# Patient Record
Sex: Female | Born: 1960 | Race: White | Hispanic: No | Marital: Single | Smoking: Never smoker
Health system: Southern US, Community
[De-identification: ages and names within clinical notes are randomized; demographics above are authoritative.]

## PROBLEM LIST (undated history)

## (undated) DIAGNOSIS — B9689 Other specified bacterial agents as the cause of diseases classified elsewhere: Secondary | ICD-10-CM

## (undated) DIAGNOSIS — K219 Gastro-esophageal reflux disease without esophagitis: Secondary | ICD-10-CM

## (undated) DIAGNOSIS — N760 Acute vaginitis: Secondary | ICD-10-CM

## (undated) DIAGNOSIS — N9489 Other specified conditions associated with female genital organs and menstrual cycle: Secondary | ICD-10-CM

## (undated) DIAGNOSIS — G809 Cerebral palsy, unspecified: Secondary | ICD-10-CM

## (undated) DIAGNOSIS — F419 Anxiety disorder, unspecified: Secondary | ICD-10-CM

## (undated) DIAGNOSIS — N898 Other specified noninflammatory disorders of vagina: Principal | ICD-10-CM

## (undated) DIAGNOSIS — R234 Changes in skin texture: Secondary | ICD-10-CM

## (undated) HISTORY — PX: MANDIBLE FRACTURE SURGERY: SHX706

## (undated) HISTORY — DX: Other specified conditions associated with female genital organs and menstrual cycle: N94.89

## (undated) HISTORY — DX: Anxiety disorder, unspecified: F41.9

## (undated) HISTORY — DX: Gastro-esophageal reflux disease without esophagitis: K21.9

## (undated) HISTORY — DX: Other specified noninflammatory disorders of vagina: N89.8

## (undated) HISTORY — PX: FOOT SURGERY: SHX648

## (undated) HISTORY — DX: Acute vaginitis: N76.0

## (undated) HISTORY — PX: LEG SURGERY: SHX1003

## (undated) HISTORY — DX: Other specified bacterial agents as the cause of diseases classified elsewhere: B96.89

## (undated) HISTORY — DX: Changes in skin texture: R23.4

## (undated) HISTORY — DX: Cerebral palsy, unspecified: G80.9

---

## 2004-08-01 ENCOUNTER — Encounter: Payer: Self-pay | Admitting: Unknown Physician Specialty

## 2004-08-02 ENCOUNTER — Encounter: Payer: Self-pay | Admitting: Unknown Physician Specialty

## 2004-09-02 ENCOUNTER — Encounter: Payer: Self-pay | Admitting: Unknown Physician Specialty

## 2007-02-27 ENCOUNTER — Ambulatory Visit: Payer: Self-pay | Admitting: Unknown Physician Specialty

## 2007-04-03 ENCOUNTER — Ambulatory Visit: Payer: Self-pay | Admitting: Unknown Physician Specialty

## 2008-09-12 IMAGING — US US PELV - US TRANSVAGINAL
1 series · 17 of 25 positions shown · non-contrast
Comparison: none

REASON FOR EXAM: Dysfunctional bleeding
COMMENTS:   Patient in wheelchair

[Series 1: us pelv - us transvaginal · 17 of 34 slices shown]
[im 1/34]
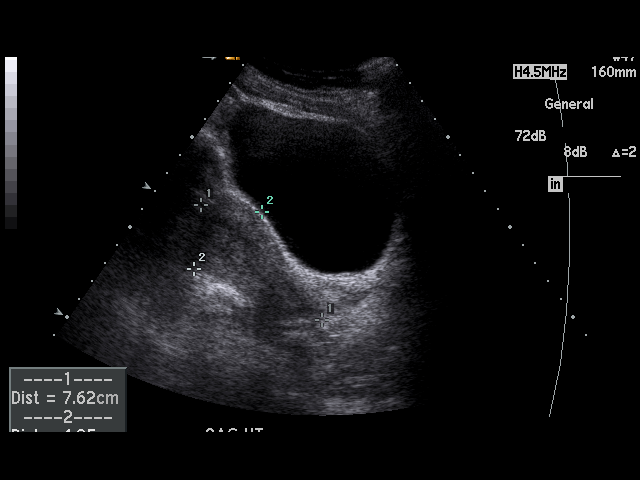
[im 3/34]
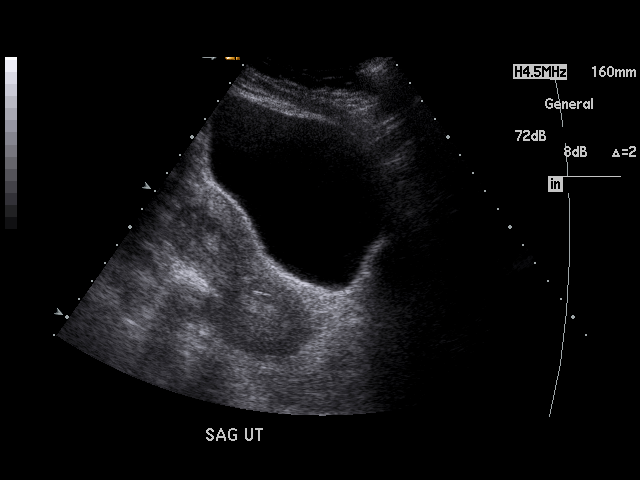
[im 5/34]
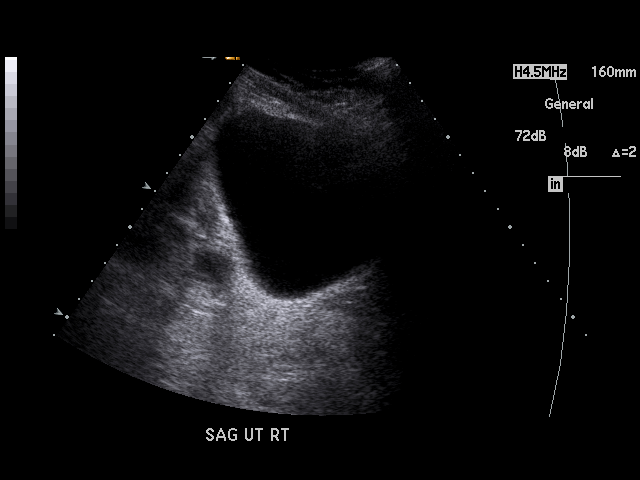
[im 7/34]
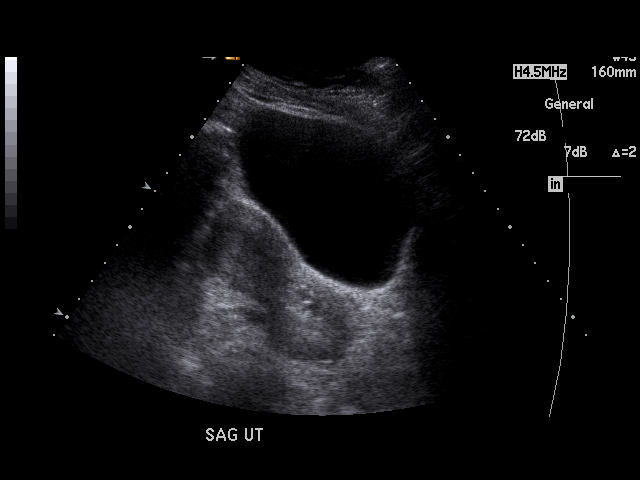
[im 9/34]
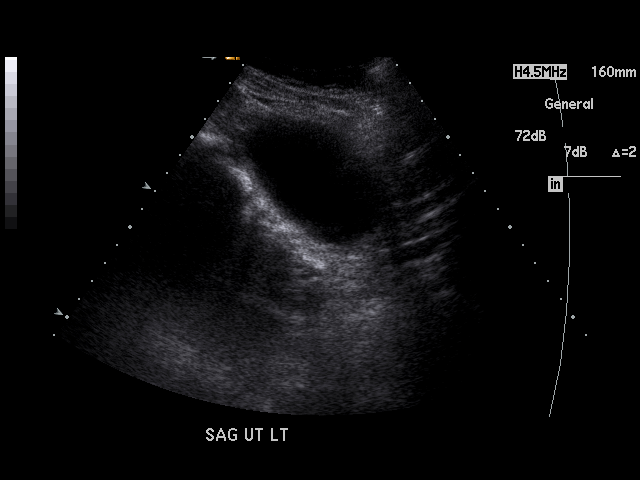
[im 12/34]
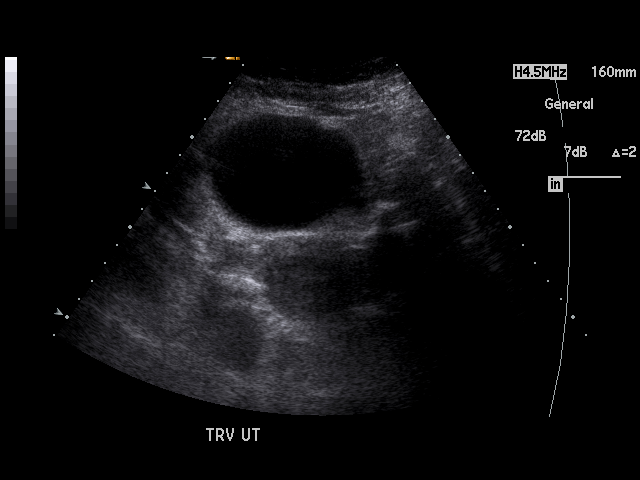
[im 13/34]
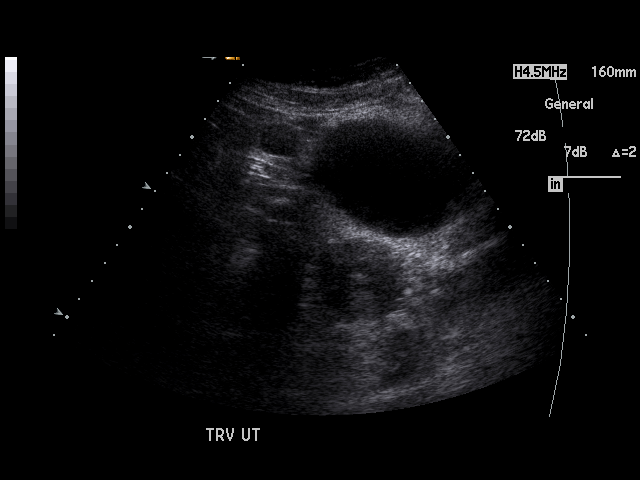
[im 16/34]
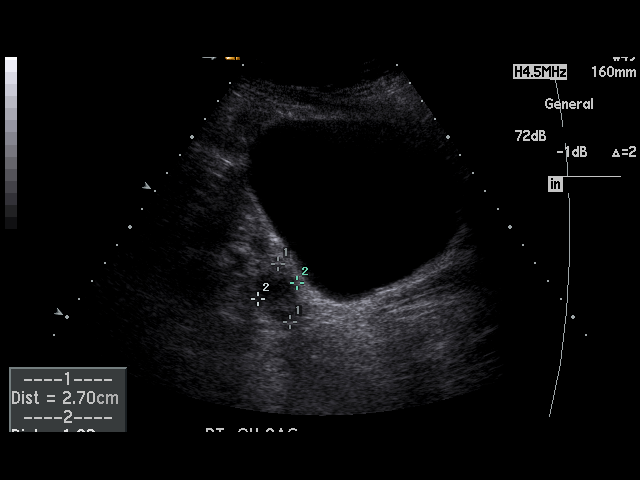
[im 17/34]
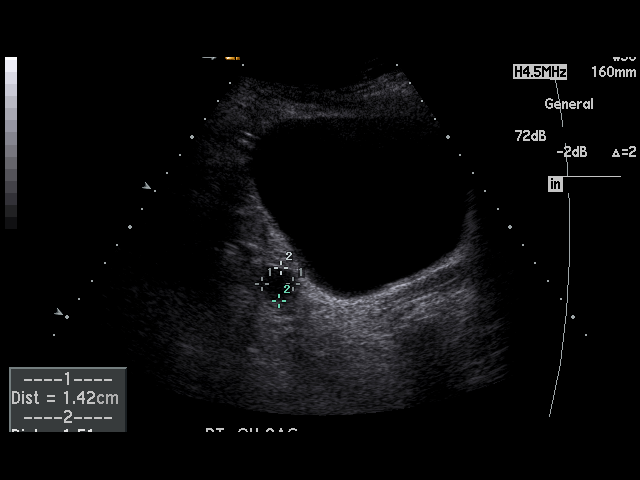
[im 18/34]
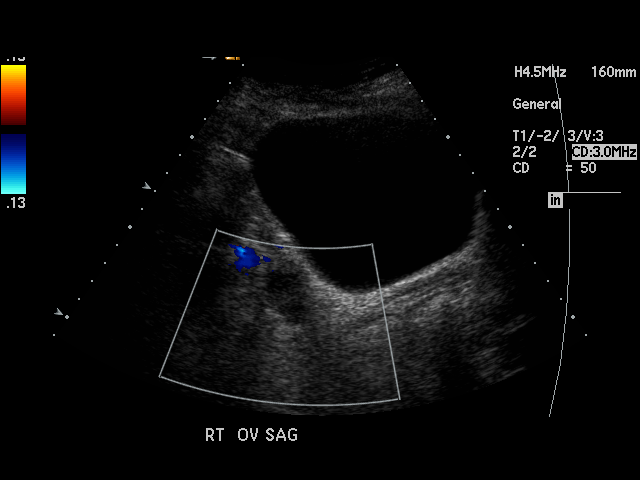
[im 21/34]
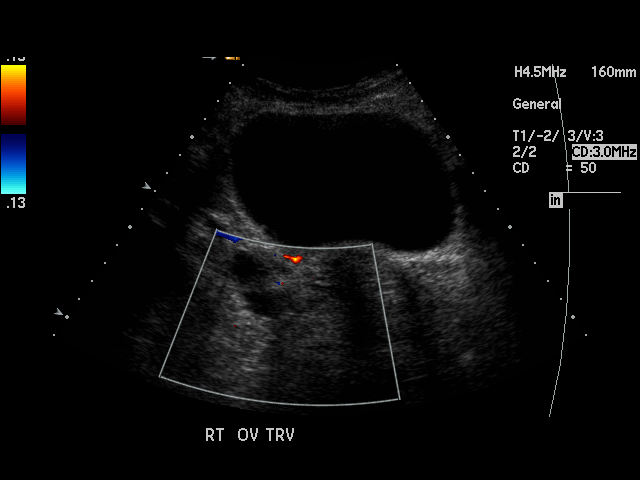
[im 23/34]
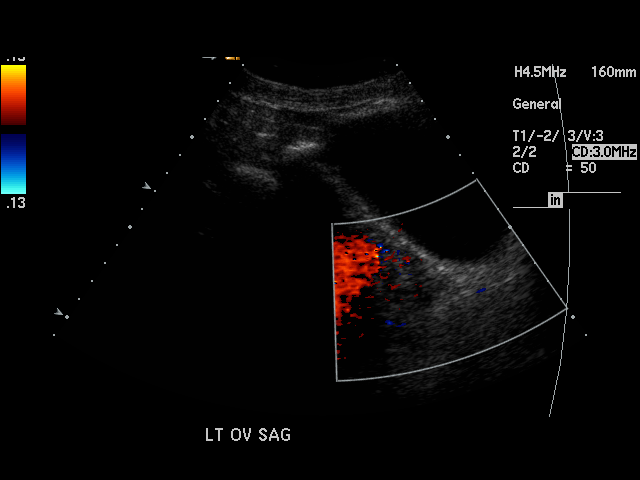
[im 25/34]
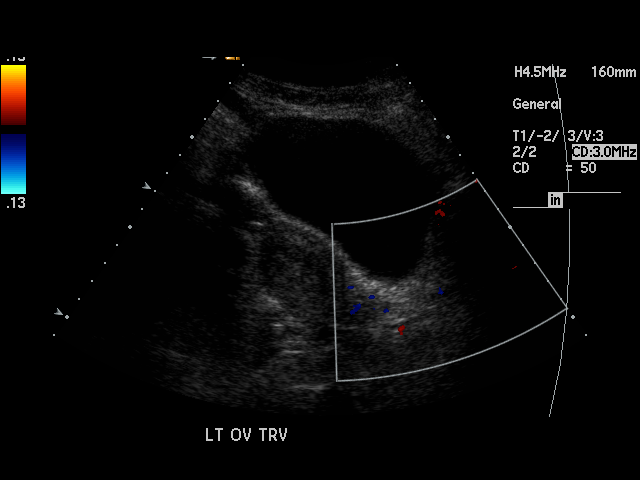
[im 27/34]
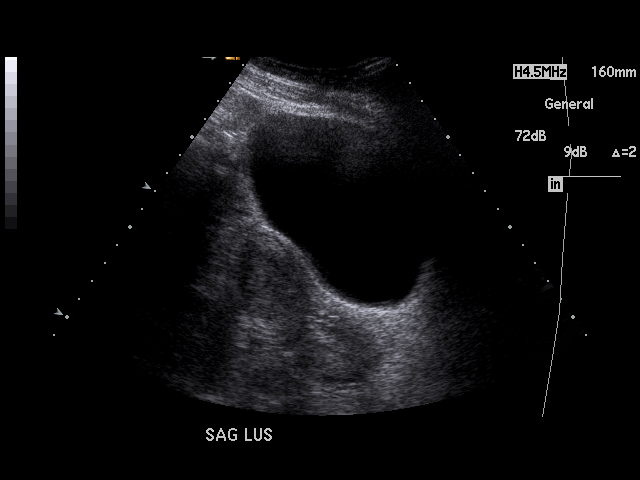
[im 29/34]
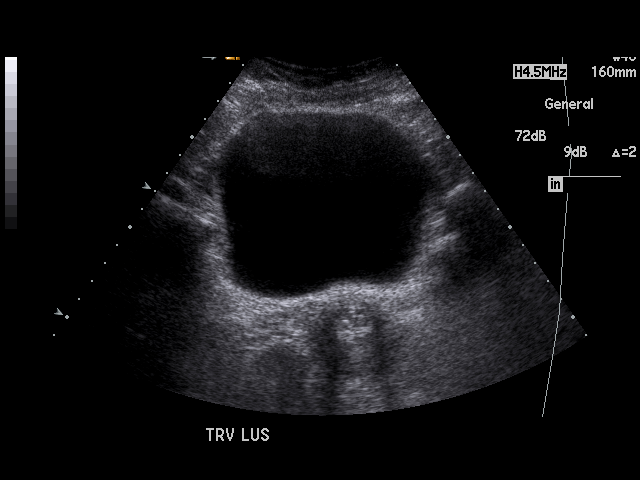
[im 31/34]
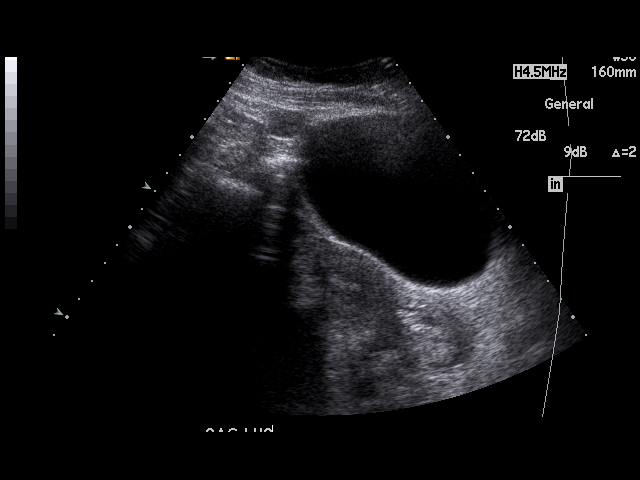
[im 34/34]
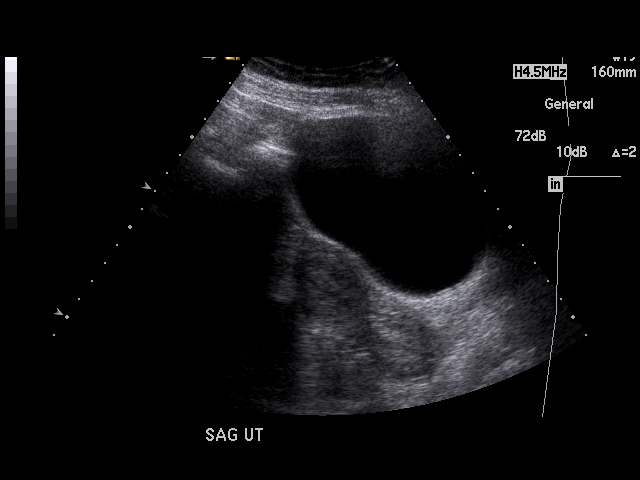

[17 of 25 positions shown; findings below may reference images not displayed]

PROCEDURE:     US  - US PELVIS MASS EXAM  - [DATE] [DATE] [DATE]  [DATE]

RESULT:     The uterus measures 7.62 cm x 4.05 cm x 4.76 cm. No uterine mass
lesions are seen. The endometrium measures 7.9 mm in thickness. There is
noted echogenic material in the endometrium and particularly in the lower
uterine segment most compatible with clot. The ovaries are visualized
bilaterally. The RIGHT ovary measures 2.7 cm at maximum diameter. The LEFT
ovary measures 2.03 cm at maximum diameter. There is a 1.7 cm, simple cyst
of the RIGHT ovary. No additional, abnormal adnexal masses are seen. There
is no free fluid in the pelvis. The visualized portion of the urinary
bladder is normal in appearance.
IMPRESSION: 1.  There is observed echogenic material in the endometrial cavity, likely
secondary to clot.
2.  No uterine mass lesions are seen.
3.  No free fluid is observed in the pelvis.
4.  There is observed a simple cyst of the RIGHT ovary.

## 2010-04-17 ENCOUNTER — Observation Stay: Payer: Self-pay | Admitting: Gastroenterology

## 2010-04-19 LAB — PATHOLOGY REPORT

## 2012-02-06 ENCOUNTER — Emergency Department: Payer: Self-pay | Admitting: Emergency Medicine

## 2013-03-09 ENCOUNTER — Emergency Department: Payer: Self-pay | Admitting: Emergency Medicine

## 2013-03-09 LAB — COMPREHENSIVE METABOLIC PANEL
Albumin: 4 g/dL (ref 3.4–5.0)
Alkaline Phosphatase: 106 U/L (ref 50–136)
Anion Gap: 3 — ABNORMAL LOW (ref 7–16)
Calcium, Total: 10 mg/dL (ref 8.5–10.1)
Chloride: 109 mmol/L — ABNORMAL HIGH (ref 98–107)
Co2: 28 mmol/L (ref 21–32)
EGFR (African American): >60
Glucose: 82 mg/dL (ref 65–99)
Potassium: 4 mmol/L (ref 3.5–5.1)
SGOT(AST): 38 U/L — ABNORMAL HIGH (ref 15–37)
SGPT (ALT): 46 U/L (ref 12–78)
Sodium: 140 mmol/L (ref 136–145)

## 2013-03-09 LAB — PROTIME-INR
INR: 1
Prothrombin Time: 12.9 secs (ref 11.5–14.7)

## 2013-03-09 LAB — CBC
HCT: 42.4 % (ref 35.0–47.0)
HGB: 14.9 g/dL (ref 12.0–16.0)
MCHC: 35.3 g/dL (ref 32.0–36.0)
Platelet: 213 10*3/uL (ref 150–440)
RBC: 4.8 10*6/uL (ref 3.80–5.20)
WBC: 7.2 10*3/uL (ref 3.6–11.0)

## 2013-03-09 LAB — CK TOTAL AND CKMB (NOT AT ARMC)
CK, Total: 65 U/L (ref 21–215)
CK-MB: 1.5 ng/mL (ref 0.5–3.6)

## 2013-03-09 LAB — TROPONIN I
Troponin-I: <0.02 ng/mL
Troponin-I: <0.02 ng/mL

## 2013-03-10 ENCOUNTER — Emergency Department: Payer: Self-pay | Admitting: Emergency Medicine

## 2013-03-10 LAB — URINALYSIS, COMPLETE
Bacteria: NONE SEEN
Bilirubin,UR: NEGATIVE
Blood: NEGATIVE
Glucose,UR: NEGATIVE mg/dL (ref 0–75)
Nitrite: NEGATIVE
Ph: 7 (ref 4.5–8.0)
Protein: NEGATIVE
Specific Gravity: 1.024 (ref 1.003–1.030)
WBC UR: 1 /HPF (ref 0–5)

## 2013-03-10 LAB — TROPONIN I: Troponin-I: <0.02 ng/mL

## 2013-03-13 ENCOUNTER — Emergency Department: Payer: Self-pay | Admitting: Emergency Medicine

## 2013-03-13 LAB — CBC
HCT: 40.7 % (ref 35.0–47.0)
HGB: 14.4 g/dL (ref 12.0–16.0)
MCHC: 35.8 g/dL (ref 32.0–36.0)
MCV: 89 fL (ref 80–100)
MCV: 89 fL (ref 80–100)
Platelet: 194 10*3/uL (ref 150–440)
Platelet: 224 10*3/uL (ref 150–440)
RBC: 4.54 10*6/uL (ref 3.80–5.20)
RDW: 13.5 % (ref 11.5–14.5)
RDW: 13.7 % (ref 11.5–14.5)

## 2013-03-13 LAB — BASIC METABOLIC PANEL
BUN: 13 mg/dL (ref 7–18)
Calcium, Total: 9.3 mg/dL (ref 8.5–10.1)
Chloride: 109 mmol/L — ABNORMAL HIGH (ref 98–107)
Co2: 26 mmol/L (ref 21–32)
Creatinine: 0.68 mg/dL (ref 0.60–1.30)
EGFR (African American): >60
EGFR (Non-African Amer.): >60
Glucose: 91 mg/dL (ref 65–99)
Osmolality: 283 (ref 275–301)
Potassium: 3.6 mmol/L (ref 3.5–5.1)
Sodium: 142 mmol/L (ref 136–145)

## 2013-03-13 LAB — COMPREHENSIVE METABOLIC PANEL
Albumin: 4.2 g/dL (ref 3.4–5.0)
Alkaline Phosphatase: 104 U/L (ref 50–136)
BUN: 11 mg/dL (ref 7–18)
Bilirubin,Total: 0.5 mg/dL (ref 0.2–1.0)
Chloride: 107 mmol/L (ref 98–107)
Co2: 29 mmol/L (ref 21–32)
Creatinine: 0.71 mg/dL (ref 0.60–1.30)
EGFR (African American): >60
SGOT(AST): 31 U/L (ref 15–37)
SGPT (ALT): 39 U/L (ref 12–78)
Total Protein: 7.7 g/dL (ref 6.4–8.2)

## 2013-03-13 LAB — URINALYSIS, COMPLETE
Bilirubin,UR: NEGATIVE
Blood: NEGATIVE
Protein: NEGATIVE

## 2013-03-13 LAB — PRO B NATRIURETIC PEPTIDE: B-Type Natriuretic Peptide: 107 pg/mL (ref 0–125)

## 2013-03-13 LAB — TROPONIN I: Troponin-I: <0.02 ng/mL

## 2013-04-01 ENCOUNTER — Ambulatory Visit: Payer: Self-pay | Admitting: Internal Medicine

## 2014-09-23 IMAGING — CR DG CHEST 1V PORT
1 series · 1 of 1 positions shown · non-contrast
Comparison: none

REASON FOR EXAM: Chest Pain
COMMENTS:

PROCEDURE:     DXR - DXR PORTABLE CHEST SINGLE VIEW  - March 09, 2013  [DATE]
RESULT:     The lungs are clear. The heart and pulmonary vessels are normal.
The bony and mediastinal structures are unremarkable. There is no effusion.
There is no pneumothorax or evidence of congestive failure.

[ap]
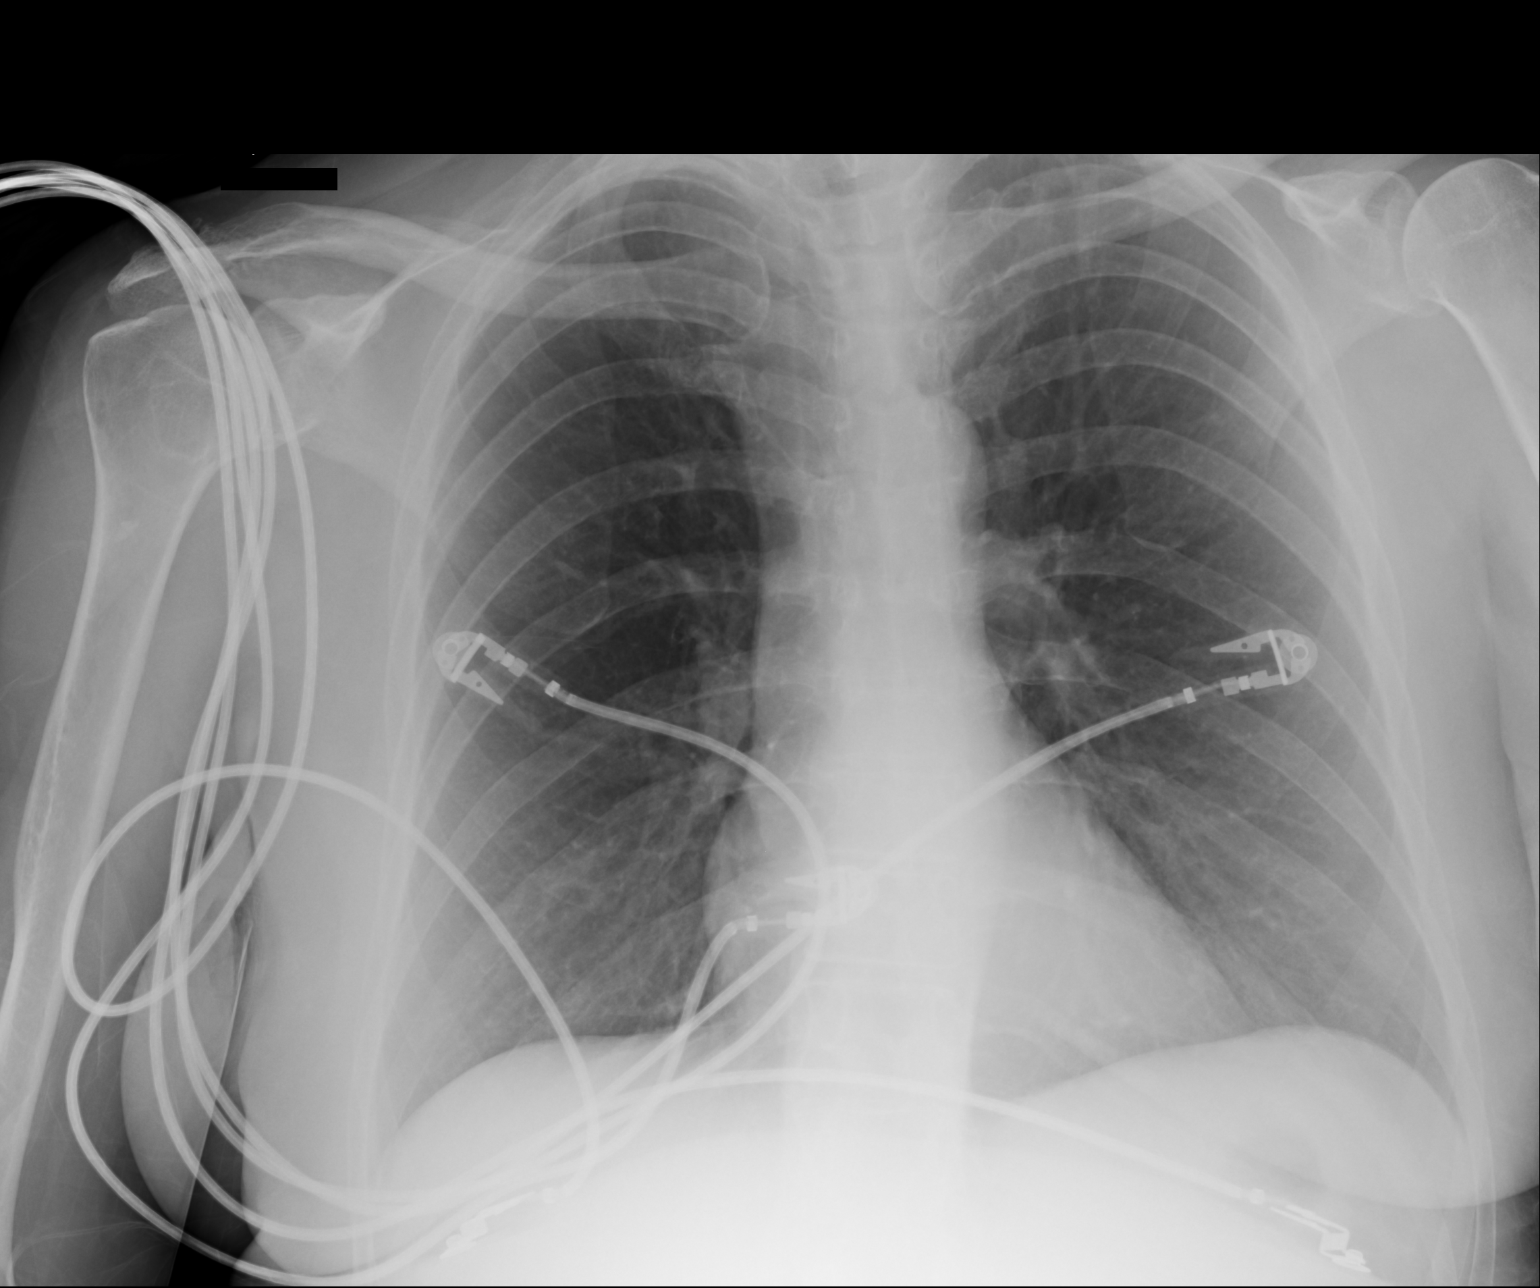

[1 of 1 positions shown; findings below may reference images not displayed]

IMPRESSION: No acute cardiopulmonary disease.

[REDACTED]

## 2014-09-27 IMAGING — CR DG CHEST 2V
1 series · 3 of 3 positions shown · non-contrast
Comparison: none

REASON FOR EXAM: SOB
COMMENTS:

PROCEDURE:     DXR - DXR CHEST PA (OR AP) AND LATERAL  - March 13, 2013  [DATE]
RESULT:     The lungs are clear. The heart and pulmonary vessels are normal.
The bony and mediastinal structures are unremarkable. There is no effusion.
There is no pneumothorax or evidence of congestive failure.

[Series 1: x chest ap · 0.14mm/px · 3 of 3 slices shown]
[im 1/3]
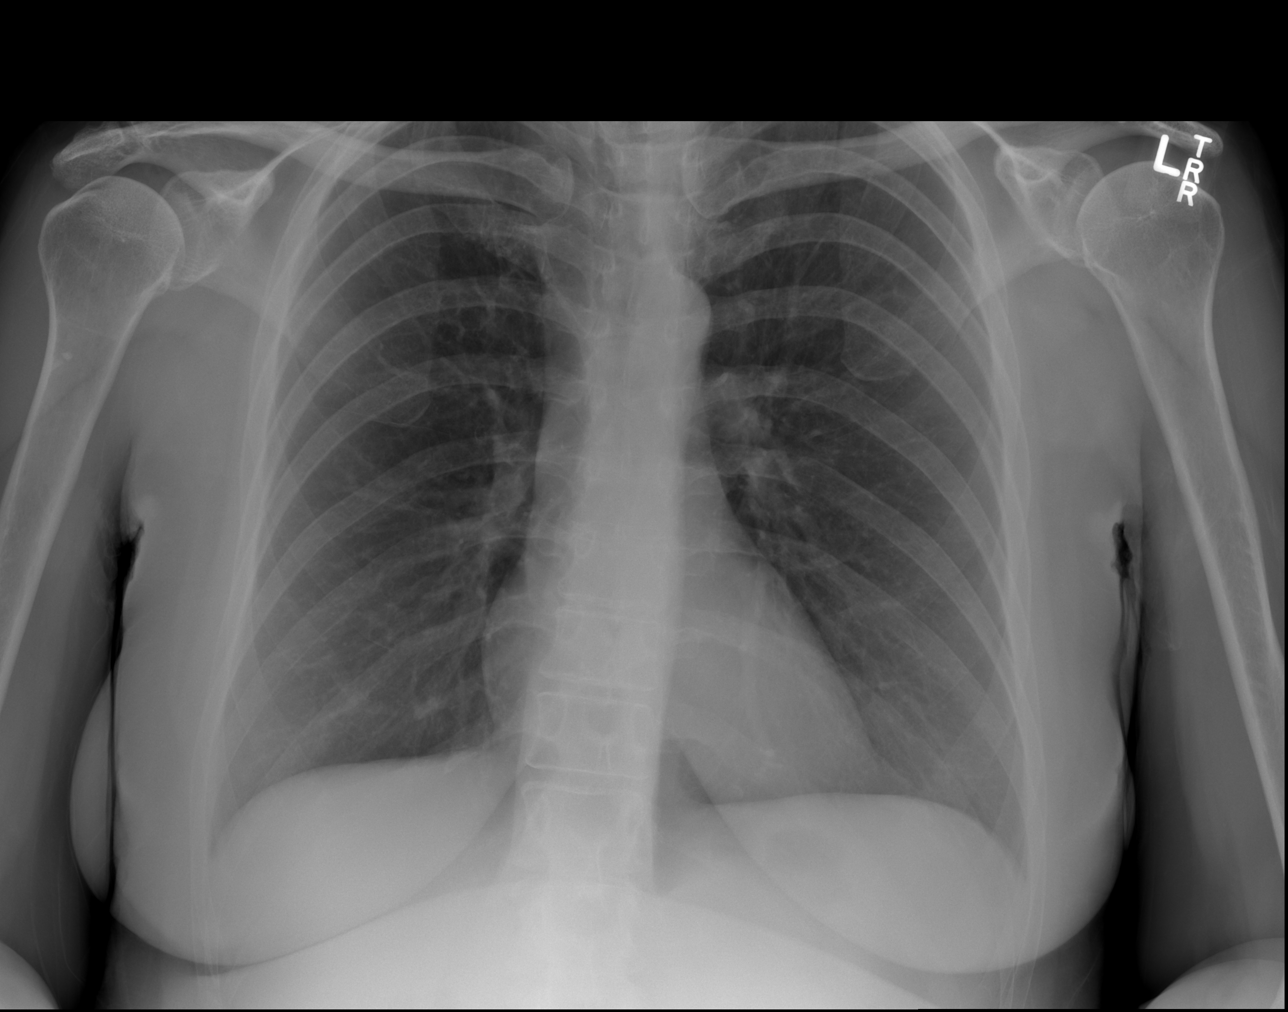
[im 2/3]
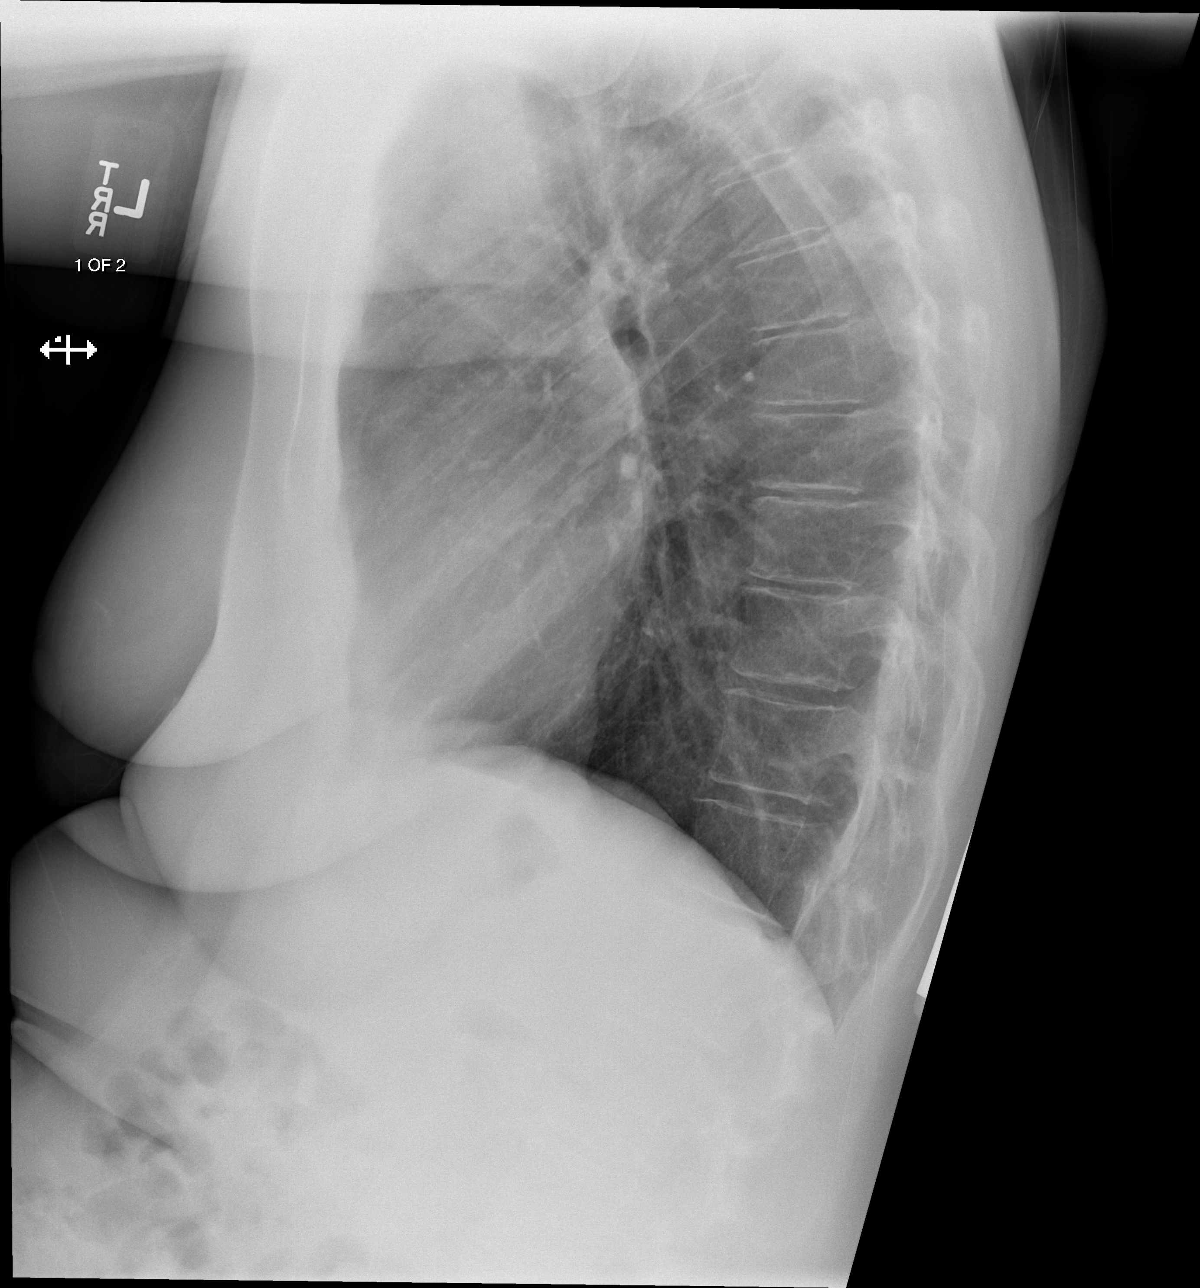
[im 3/3]
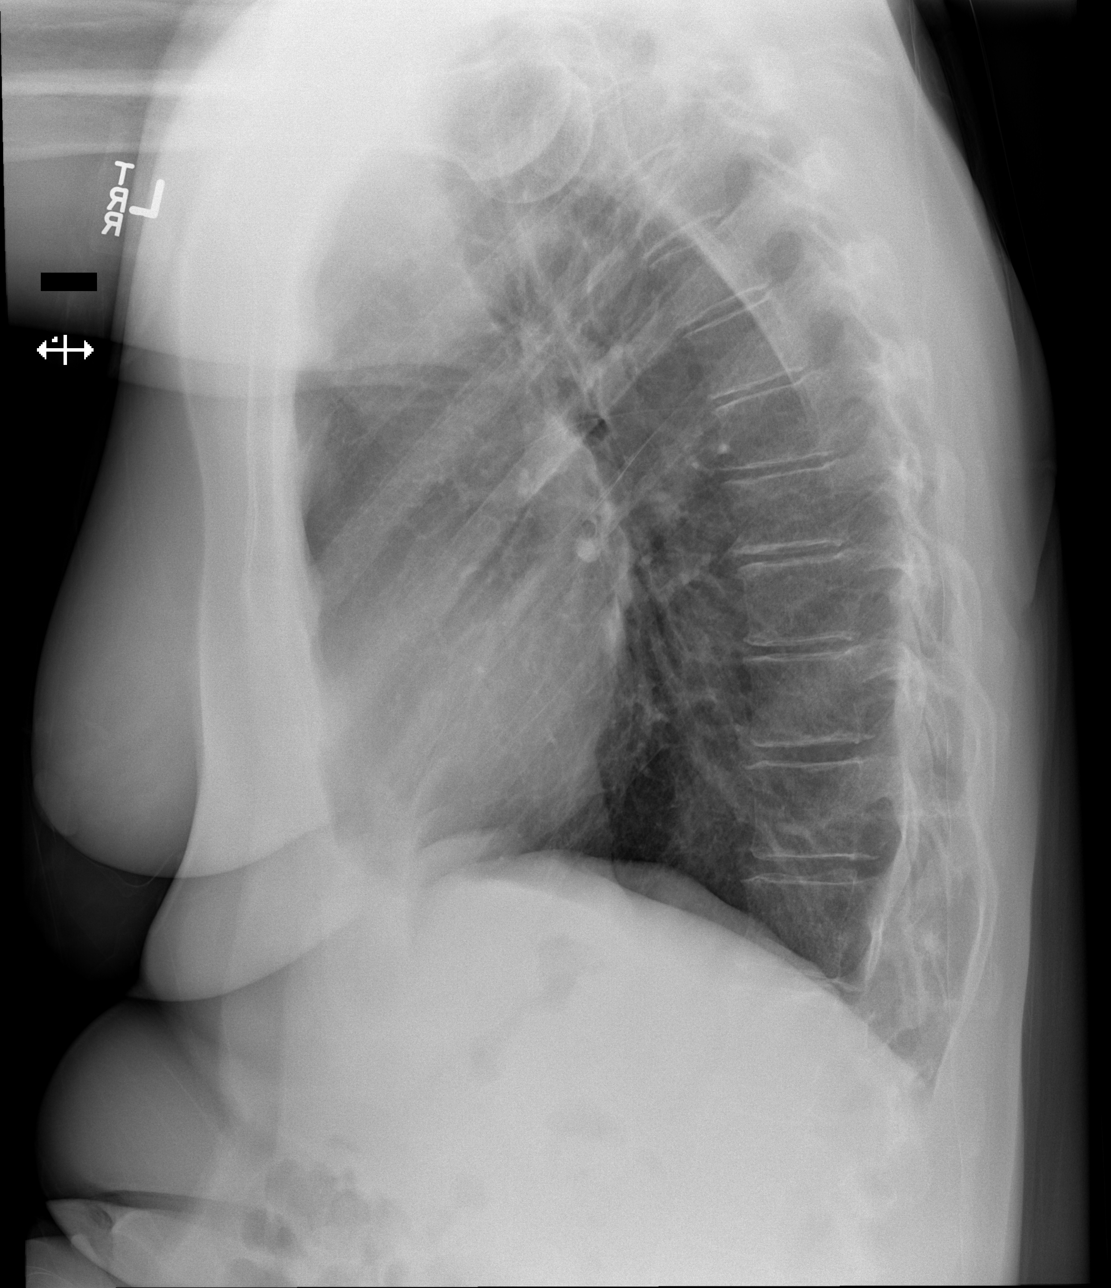

[3 of 3 positions shown; findings below may reference images not displayed]

IMPRESSION: No acute cardiopulmonary disease.

[REDACTED]

## 2015-01-11 ENCOUNTER — Ambulatory Visit (INDEPENDENT_AMBULATORY_CARE_PROVIDER_SITE_OTHER): Payer: Medicare Other | Admitting: Adult Health

## 2015-01-11 ENCOUNTER — Encounter: Payer: Self-pay | Admitting: Adult Health

## 2015-01-11 VITALS — BP 108/68 | HR 80

## 2015-01-11 DIAGNOSIS — N760 Acute vaginitis: Secondary | ICD-10-CM | POA: Insufficient documentation

## 2015-01-11 DIAGNOSIS — B9689 Other specified bacterial agents as the cause of diseases classified elsewhere: Secondary | ICD-10-CM

## 2015-01-11 DIAGNOSIS — N9489 Other specified conditions associated with female genital organs and menstrual cycle: Secondary | ICD-10-CM | POA: Diagnosis not present

## 2015-01-11 DIAGNOSIS — A499 Bacterial infection, unspecified: Secondary | ICD-10-CM | POA: Diagnosis not present

## 2015-01-11 DIAGNOSIS — N898 Other specified noninflammatory disorders of vagina: Secondary | ICD-10-CM

## 2015-01-11 HISTORY — DX: Other specified bacterial agents as the cause of diseases classified elsewhere: B96.89

## 2015-01-11 HISTORY — DX: Other specified noninflammatory disorders of vagina: N89.8

## 2015-01-11 LAB — POCT WET PREP (WET MOUNT): WBC WET PREP: POSITIVE

## 2015-01-11 MED ORDER — METRONIDAZOLE 500 MG PO TABS: 500.0000 mg | ORAL_TABLET | Freq: Two times a day (BID) | ORAL | Status: DC

## 2015-01-11 NOTE — Progress Notes (Signed)
Subjective:     Patient ID: Terri Parker, female   DOB: 02-13-1961, 54 y.o.   MRN: [id number]  HPI Terri Parker is a 54 year old white female, new to this practice in complaining of vaginal odor.She resides ar [facility] and has CP, she is in wheel chair.  Review of Systems Patient denies any headaches, hearing loss, fatigue, blurred vision, shortness of breath, chest pain, abdominal pain, problems with bowel movements, urination, or intercourse(does not have sex). No joint pain or mood swings.See HPI for positives.  Reviewed past medical,surgical, social and family history. Reviewed medications and allergies.     Objective:   Physical Exam BP 108/68 mmHg  Pulse 80She does not stand well and is alert and cooperative.   Skin warm and dry.Pelvic: external genitalia is normal in appearance no lesions, vagina: white discharge with slight odor, has decreased color and rugae,urethra has no lesions or masses noted, cervix:tiny not well visualized uterus: normal size, shape and contour, non tender, no masses felt, adnexa: no masses or tenderness noted. Bladder is non tender and no masses felt. Wet prep: + for clue cells and +WBCs.Pediatric speculum used, and pt tolerated well.  Assessment:     Vaginal odor BV    Plan:     Rx flagyl 500 mg 1 bid x 7 days, no alcohol, review handout on BV   Follow up prn

## 2015-01-11 NOTE — Patient Instructions (Signed)
Bacterial Vaginosis Bacterial vaginosis is a vaginal infection that occurs when the normal balance of bacteria in the vagina is disrupted. It results from an overgrowth of certain bacteria. This is the most common vaginal infection in women of childbearing age. Treatment is important to prevent complications, especially in pregnant women, as it can cause a premature delivery. CAUSES  Bacterial vaginosis is caused by an increase in harmful bacteria that are normally present in smaller amounts in the vagina. Several different kinds of bacteria can cause bacterial vaginosis. However, the reason that the condition develops is not fully understood. RISK FACTORS Certain activities or behaviors can put you at an increased risk of developing bacterial vaginosis, including:  Having a new sex partner or multiple sex partners.  Douching.  Using an intrauterine device (IUD) for contraception. Women do not get bacterial vaginosis from toilet seats, bedding, swimming pools, or contact with objects around them. SIGNS AND SYMPTOMS  Some women with bacterial vaginosis have no signs or symptoms. Common symptoms include:  Grey vaginal discharge.  A fishlike odor with discharge, especially after sexual intercourse.  Itching or burning of the vagina and vulva.  Burning or pain with urination. DIAGNOSIS  Your health care provider will take a medical history and examine the vagina for signs of bacterial vaginosis. A sample of vaginal fluid may be taken. Your health care provider will look at this sample under a microscope to check for bacteria and abnormal cells. A vaginal pH test may also be done.  TREATMENT  Bacterial vaginosis may be treated with antibiotic medicines. These may be given in the form of a pill or a vaginal cream. A second round of antibiotics may be prescribed if the condition comes back after treatment.  HOME CARE INSTRUCTIONS   Only take over-the-counter or prescription medicines as  directed by your health care provider.  If antibiotic medicine was prescribed, take it as directed. Make sure you finish it even if you start to feel better.  Do not have sex until treatment is completed.  Tell all sexual partners that you have a vaginal infection. They should see their health care provider and be treated if they have problems, such as a mild rash or itching.  Practice safe sex by using condoms and only having one sex partner. SEEK MEDICAL CARE IF:   Your symptoms are not improving after 3 days of treatment.  You have increased discharge or pain.  You have a fever. MAKE SURE YOU:   Understand these instructions.  Will watch your condition.  Will get help right away if you are not doing well or get worse. FOR MORE INFORMATION  Centers for Disease Control and Prevention, Division of STD Prevention: [employer] American Sexual Health Association (ASHA): www.ashastd.org  Document Released: 05/21/2005 Document Revised: 03/11/2013 Document Reviewed: 12/31/2012 [facility] Patient Information 2015 [place], [place]. This information is not intended to replace advice given to you by your health care provider. Make sure you discuss any questions you have with your health care provider. Take flagyl with food and water Follow up prn

## 2015-07-07 ENCOUNTER — Encounter: Payer: Self-pay | Admitting: Adult Health

## 2015-07-07 ENCOUNTER — Ambulatory Visit (INDEPENDENT_AMBULATORY_CARE_PROVIDER_SITE_OTHER): Payer: Medicare Other | Admitting: Adult Health

## 2015-07-07 VITALS — BP 144/80 | HR 82

## 2015-07-07 DIAGNOSIS — N9489 Other specified conditions associated with female genital organs and menstrual cycle: Secondary | ICD-10-CM

## 2015-07-07 DIAGNOSIS — R234 Changes in skin texture: Secondary | ICD-10-CM | POA: Diagnosis not present

## 2015-07-07 HISTORY — DX: Changes in skin texture: R23.4

## 2015-07-07 HISTORY — DX: Other specified conditions associated with female genital organs and menstrual cycle: N94.89

## 2015-07-07 MED ORDER — CLOBETASOL PROPIONATE 0.05 % EX CREA: 1.0000 "application " | TOPICAL_CREAM | Freq: Two times a day (BID) | CUTANEOUS | Status: DC

## 2015-07-07 NOTE — Progress Notes (Signed)
Subjective:     Patient ID: Carl Best, female   DOB: April 19, 1961, 55 y.o.   MRN: [id number]  HPI Concettina is a 55 year old white female, who resides in nursing home, she is in wheelchair and has CP, she complains of having had pain in vagina area on left, but it is better now, was told years ago was cyst from straining when getting out of tub.  Review of Systems History of pain left labia, all other systems negative Reviewed past medical,surgical, social and family history. Reviewed medications and allergies.      Objective:   Physical Exam BP 144/80 mmHg  Pulse 82   Has skin texture changes and some areas of white skin left labia, in inner labia there is 2 cm ridge of white tissue, non tender, legs tight and some what contracted, did not perform bimanual.  Assessment:     Labial pain Skin texture changes, ?LSA    Plan:     Rx temovate cream 0.05% cream use bid, with 1 refill Follow up in 4 weeks with MD for possible biopsy and will make appt with me same time

## 2015-07-07 NOTE — Patient Instructions (Signed)
Use temovate cream bid to labia Follow up in 4 weeks with MD for possible biopsy

## 2015-08-04 ENCOUNTER — Ambulatory Visit: Payer: Medicare Other | Admitting: Adult Health

## 2015-08-08 ENCOUNTER — Encounter: Payer: Self-pay | Admitting: Adult Health

## 2015-08-08 ENCOUNTER — Ambulatory Visit (INDEPENDENT_AMBULATORY_CARE_PROVIDER_SITE_OTHER): Payer: Medicare Other | Admitting: Adult Health

## 2015-08-08 ENCOUNTER — Telehealth: Payer: Self-pay | Admitting: *Deleted

## 2015-08-08 VITALS — BP 120/80 | HR 76

## 2015-08-08 DIAGNOSIS — R234 Changes in skin texture: Secondary | ICD-10-CM

## 2015-08-08 MED ORDER — HYDROCORTISONE 1 % EX OINT: TOPICAL_OINTMENT | CUTANEOUS | Status: AC

## 2015-08-08 NOTE — Patient Instructions (Signed)
Use hydrocortisone 1% ointment 2-3 x weekly to affected area Follow up prn

## 2015-08-08 NOTE — Telephone Encounter (Signed)
No voice mail box.

## 2015-08-08 NOTE — Progress Notes (Signed)
Subjective:     Patient ID: Carl BestMary J Gangi, female   DOB: 01/04/1961, 55 y.o.   MRN: [id number]  HPI Corrie DandyMary is a 55 year old white female with CP, who is wheel chair bound is back in follow up after using temovate, for skin texture changes and pain, has had no more pain in labia and stopped the temovate about a week ago, says it was too expensive.  Review of Systems  Has no pain in labia, all other systems negative Reviewed past medical,surgical, social and family history. Reviewed medications and allergies.     Objective:   Physical Exam BP 120/80 mmHg  Pulse 76   Skin warm and dry, has leg contractures, area of concern on left inner labia less red and still has ridge of white tissue, Dr Emelda FearFerguson in to evaluate, and says it does not need biopsy, he felt was hyperkeratotic in nature, and to use hydrocortisone cream 2-3 x weekly   Assessment:     Skin texture changes    Plan:    Can stop temovate due to expense and use hydrocortisone 1% ointment 2-3 x weekly to affected area Follow up prn
